# Patient Record
Sex: Female | Born: 1965 | Race: White | Hispanic: No | Marital: Married | Smoking: Never smoker
Health system: Southern US, Academic
[De-identification: ages and names within clinical notes are randomized; demographics above are authoritative.]

## PROBLEM LIST (undated history)

## (undated) DIAGNOSIS — F3289 Other specified depressive episodes: Secondary | ICD-10-CM

## (undated) DIAGNOSIS — M129 Arthropathy, unspecified: Secondary | ICD-10-CM

## (undated) HISTORY — PX: CLAVICLE SURGERY: SHX598

---

## 1995-04-04 ENCOUNTER — Other Ambulatory Visit (HOSPITAL_COMMUNITY): Payer: Self-pay

## 2021-11-14 IMAGING — MR MRI CERVICAL SPINE WITHOUT CONTRAST
4 of 5 series · 23 of 48 positions shown · IV contrast (gadolinium)
Comparison: None available.

﻿EXAM:  [id number]   MRI CERVICAL SPINE WITHOUT CONTRAST
INDICATION: Neck pain with bilateral upper extremity numbness.
TECHNIQUE: Multiplanar multisequential MRI of the cervical spine was performed without gadolinium contrast.

[Series 5: T2 · sagittal · 3.0mm · 0.75mm/px · 8 of 15 slices shown (1 of 2)]
[im 1/15]
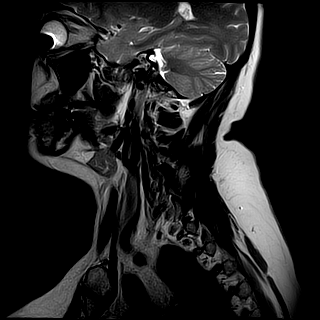
[im 3/15]
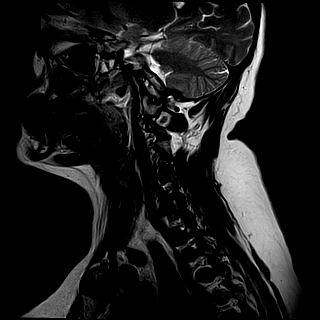
[im 5/15]
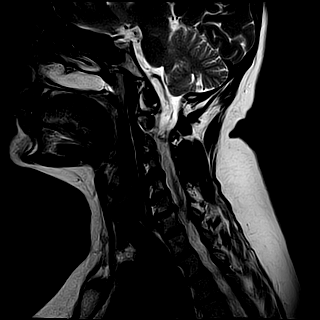
[im 7/15]
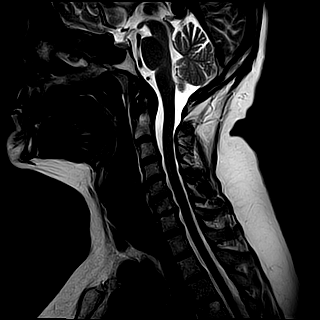
[im 9/15]
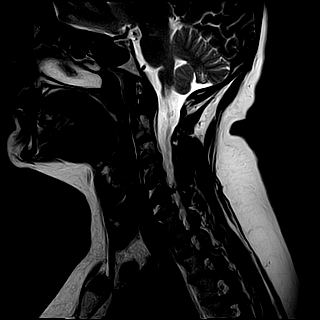
[im 11/15]
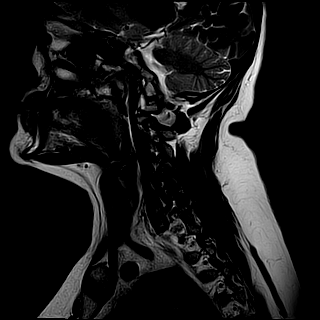
[im 13/15]
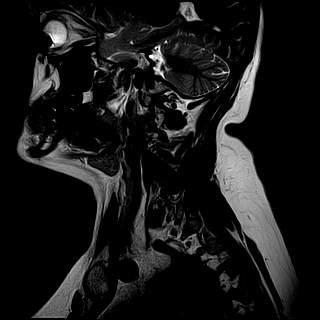
[im 15/15]
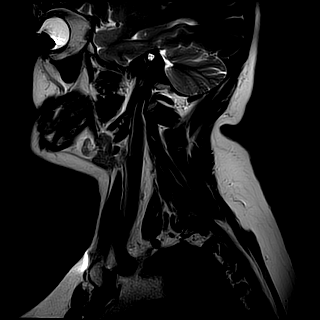

[Series 6: T1 · sagittal · 3.0mm · 0.47mm/px · 3 of 15 slices shown]
[im 2/15]
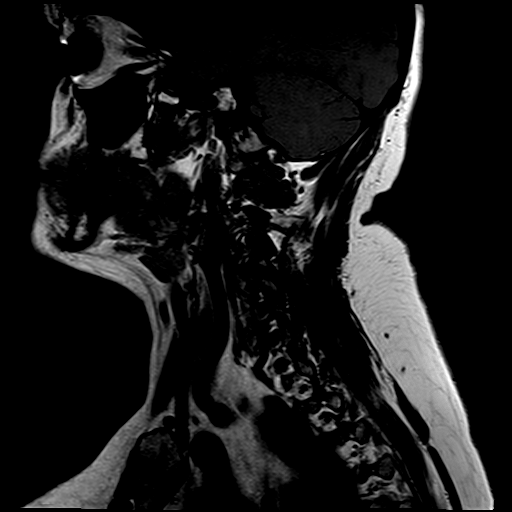
[im 8/15]
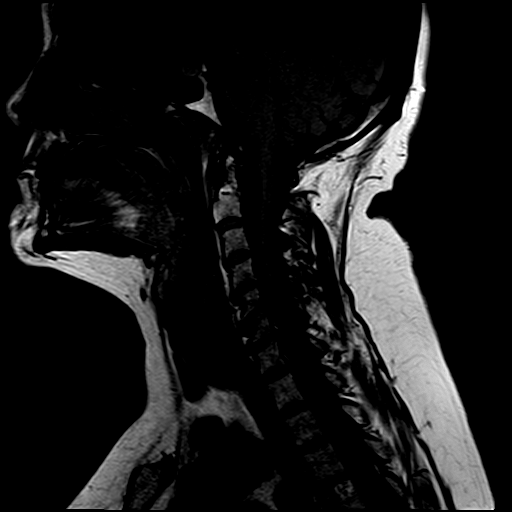
[im 13/15]
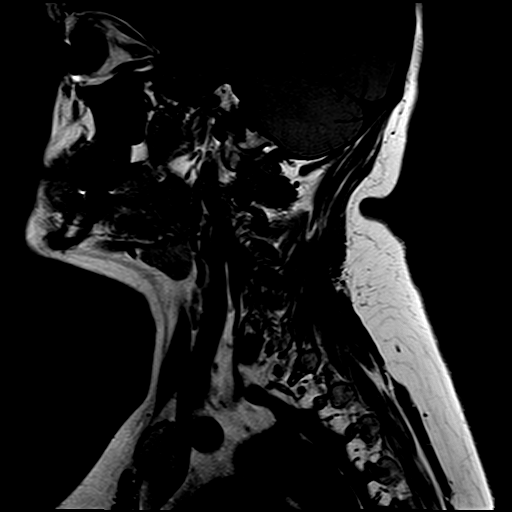

[Series 7: STIR · sagittal · 3.0mm · 0.47mm/px · 3 of 15 slices shown]
[im 2/15]
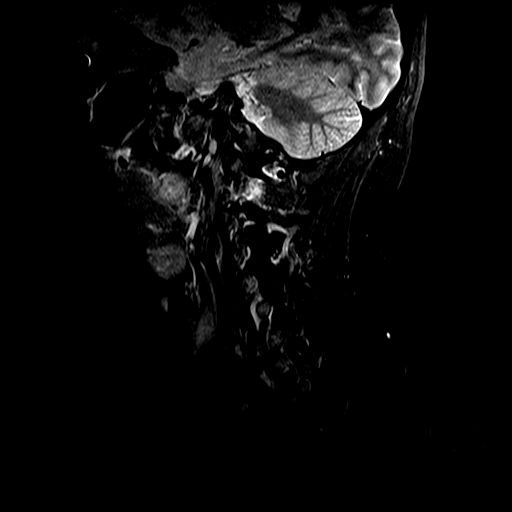
[im 8/15]
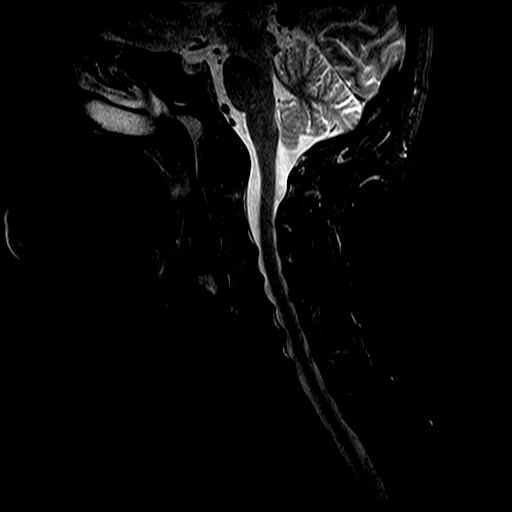
[im 13/15]
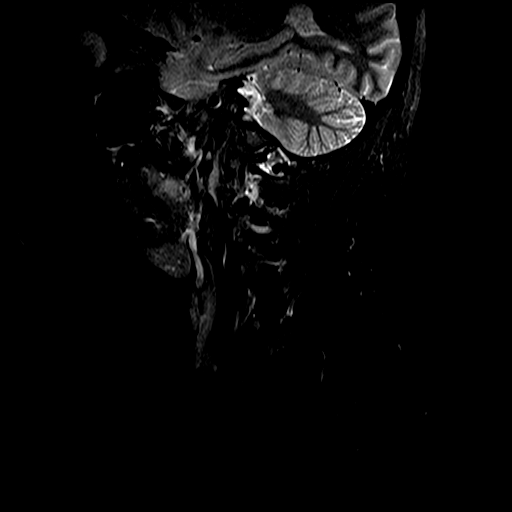

[Series 9: T2 · axial · 3.0mm · 0.39mm/px · z∈[-93,-7]mm · 9 of 18 slices shown (2 of 2)]
[im 1/18]
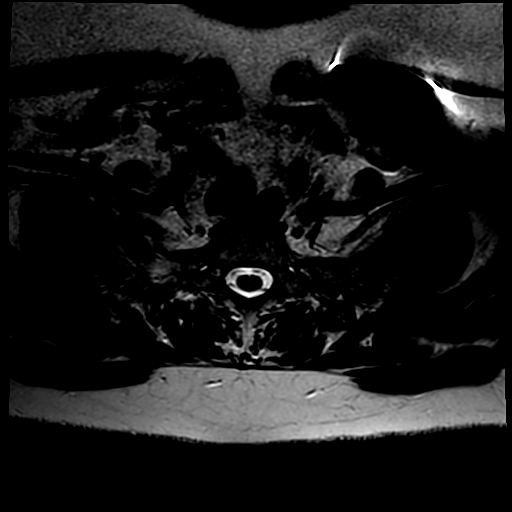
[im 2/18]
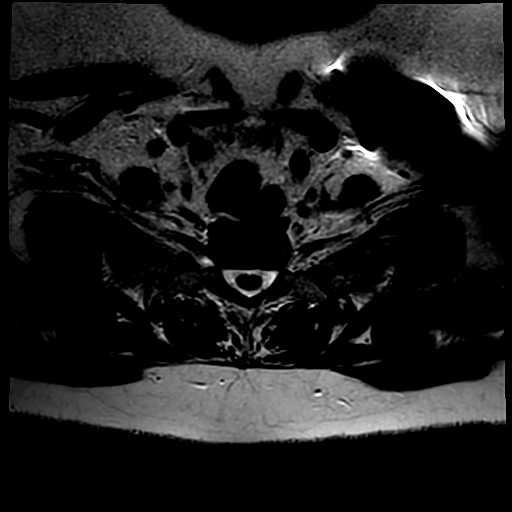
[im 4/18]
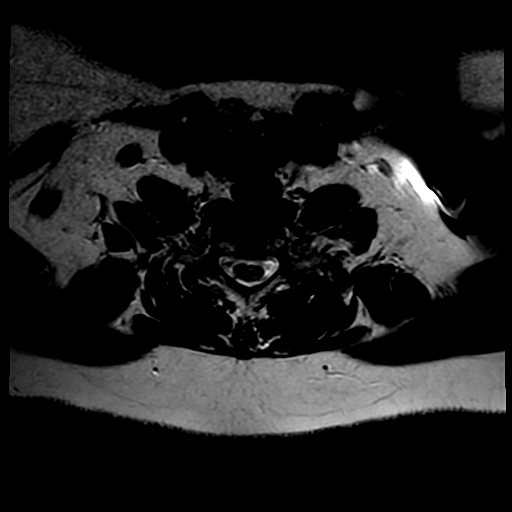
[im 6/18]
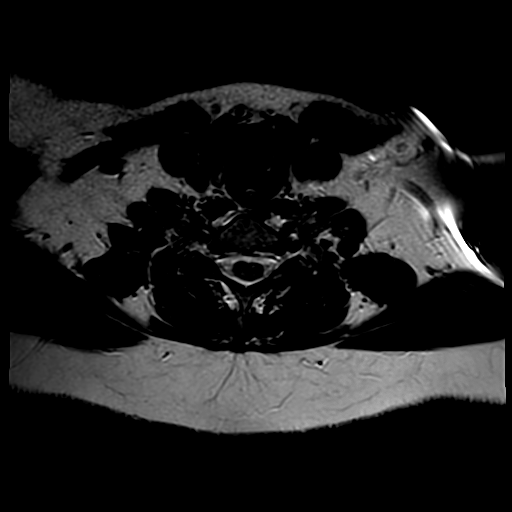
[im 7/18]
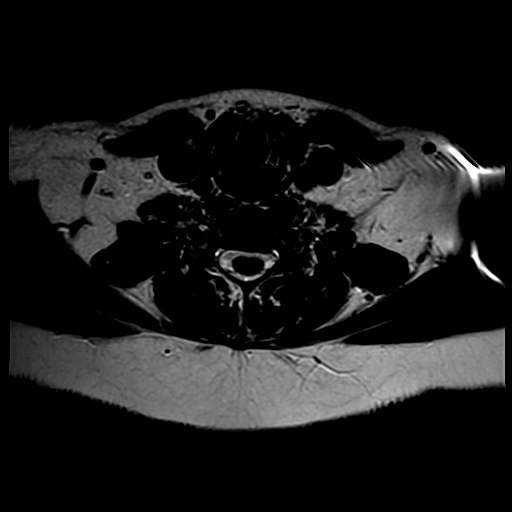
[im 9/18]
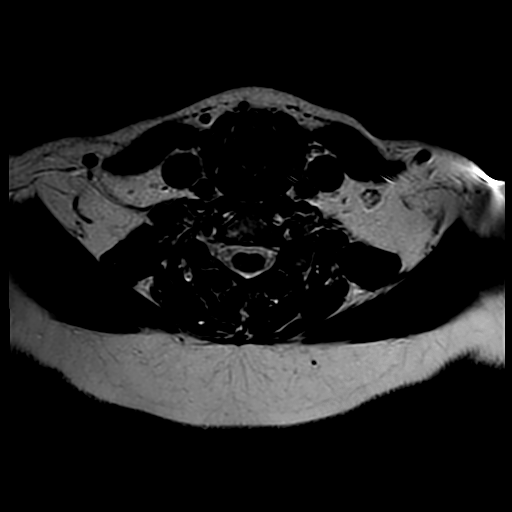
[im 11/18]
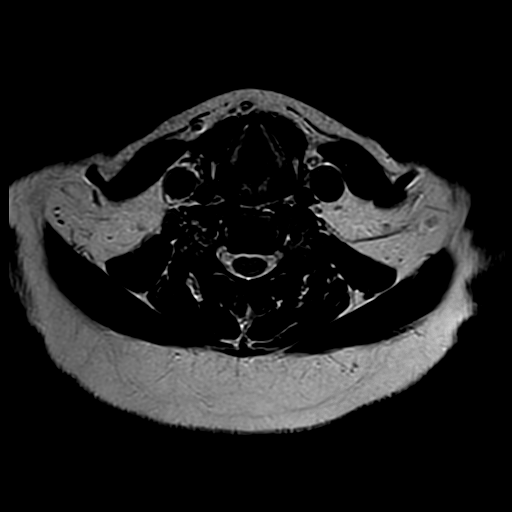
[im 12/18]
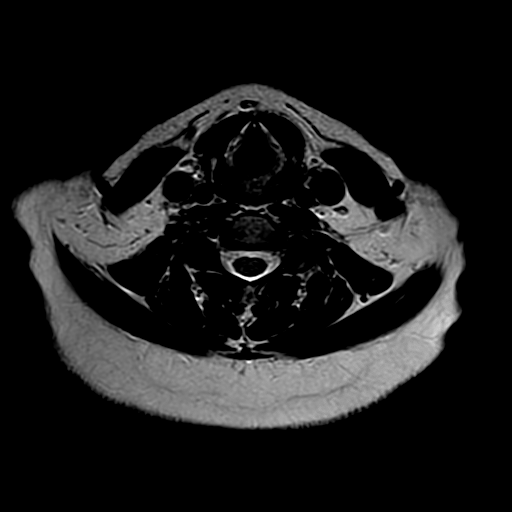
[im 16/18]
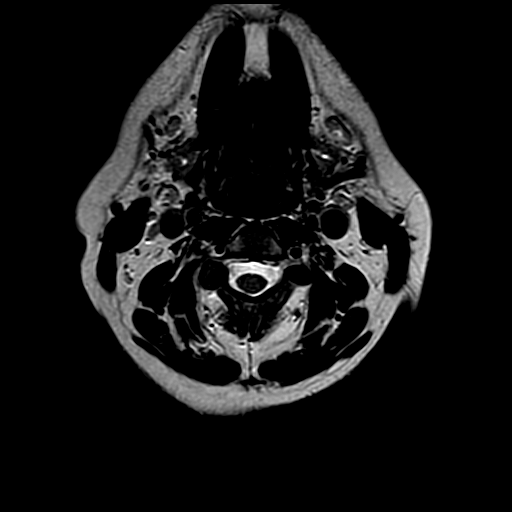

[23 of 48 positions shown; findings below may reference images not displayed]

FINDINGS: Vertebral bodies are normal in height, alignment and signal intensity. There is no acute fracture or subluxation. Visualized spinal cord is normal in signal intensity without evidence of compression at any level.

C2-3 level is unremarkable.

At C3-4 level, there is a small broad-based central disc osteophyte complex partially effacing the ventral CSF. There is no significant neural foraminal stenosis.

At C4-5 level, there is a small broad-based central disc osteophyte complex partially effacing the ventral CSF. There is no significant neural foraminal stenosis.

At C5-6 level, there is moderate to marked disc desiccation. There is a minimal bulging annulus, minimally effacing the ventral CSF. There is severe left neural foraminal stenosis from uncovertebral joint hypertrophy.

At C6-7 level, there is moderate to marked disc desiccation. There is a small broad-based central disc osteophyte complex with near complete effacement of the ventral CSF. There is mild right neural foraminal stenosis from uncovertebral joint hypertrophy.

At C7-T1 level, there is a small broad-based central disc osteophyte complex mildly effacing the ventral CSF. There is no significant neural foraminal stenosis.

Paraspinal soft tissues are unremarkable.
IMPRESSION: 1. Small disc osteophyte complexes at multiple levels without significant spinal stenosis at any level. 

2. Multilevel neural foraminal stenosis as detailed above.

## 2022-01-12 IMAGING — MR MRI LUMBAR SPINE WITHOUT CONTRAST
6 of 7 series · 33 of 48 positions shown · IV contrast (gadolinium)
Comparison: None available.

﻿EXAM:  [id number]   MRI LUMBAR SPINE WITHOUT CONTRAST
INDICATION: Chronic lower back pain.
TECHNIQUE: Multiplanar multisequential MRI of the lumbar spine was performed without gadolinium contrast.

[Series 5: T2 · sagittal · 4.0mm · 0.83mm/px · 4 of 13 slices shown (1 of 4)]
[im 1/13]
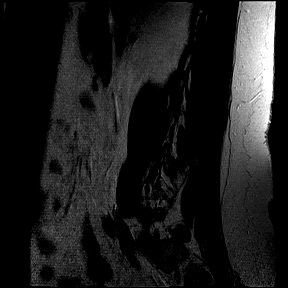
[im 5/13]
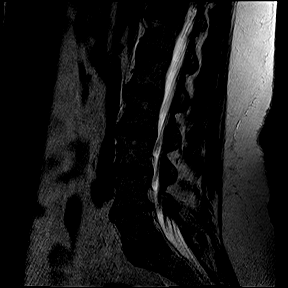
[im 9/13]
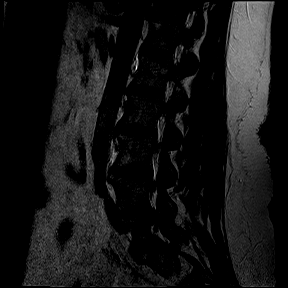
[im 13/13]
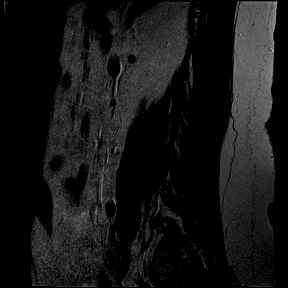

[Series 7: T2 · sagittal · 4.0mm · 0.83mm/px · 5 of 13 slices shown (2 of 4)]
[im 1/13]
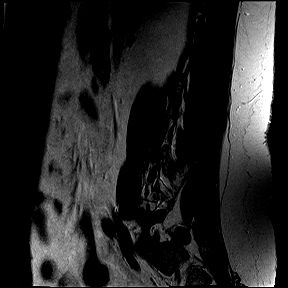
[im 4/13]
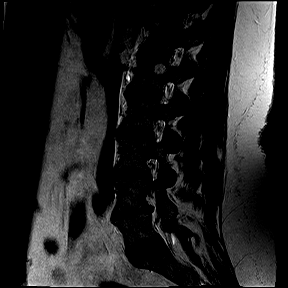
[im 7/13]
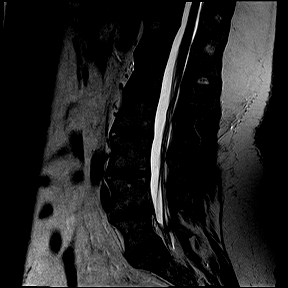
[im 10/13]
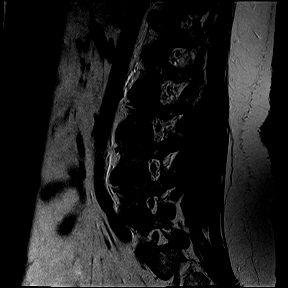
[im 13/13]
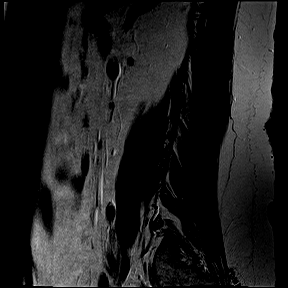

[Series 8: T1 · sagittal · 4.0mm · 0.83mm/px · 5 of 13 slices shown (1 of 2)]
[im 1/13]
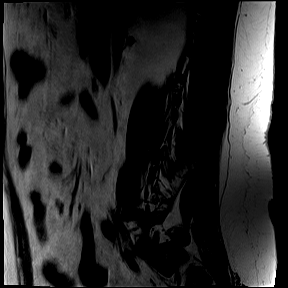
[im 4/13]
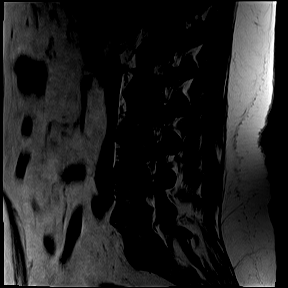
[im 7/13]
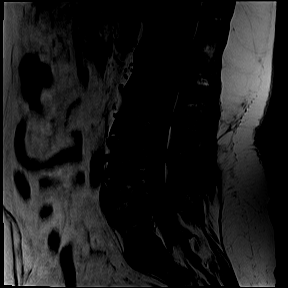
[im 10/13]
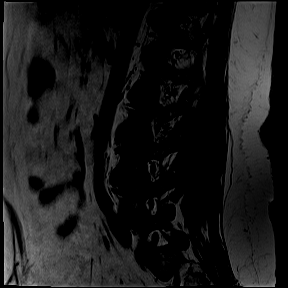
[im 13/13]
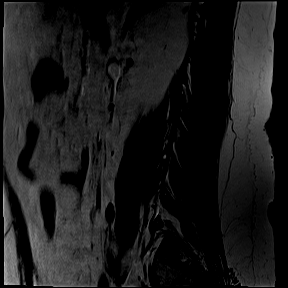

[Series 10: T1 · axial · 4.0mm · 0.52mm/px · 1 of 29 slices shown (2 of 2)]
[im 1/29]
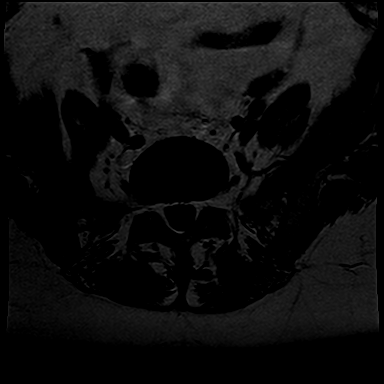

[Series 11: T2 · axial · 4.0mm · 0.52mm/px · z∈[-154,+121]mm · 11 of 29 slices shown (3 of 4)]
[im 1/29]
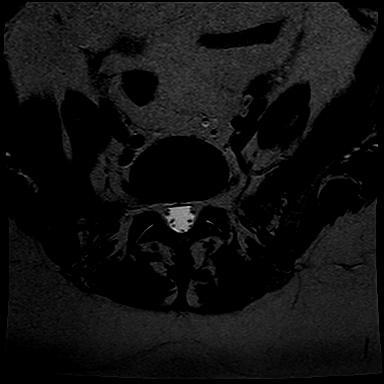
[im 3/29]
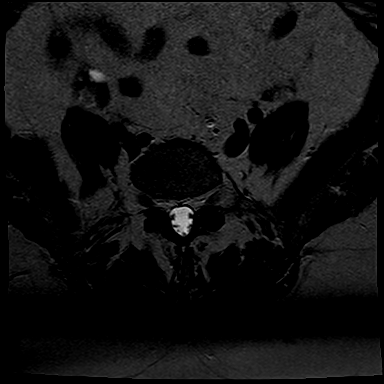
[im 6/29]
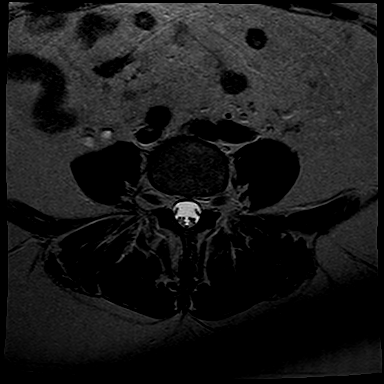
[im 9/29]
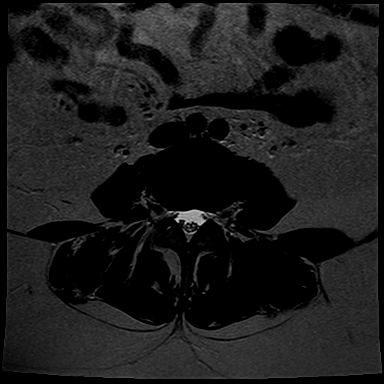
[im 12/29]
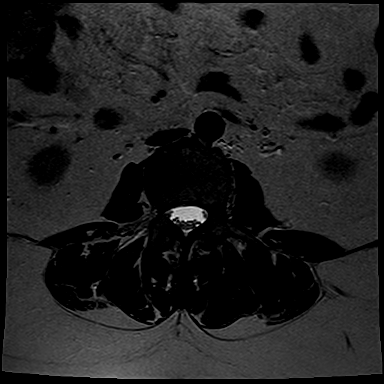
[im 15/29]
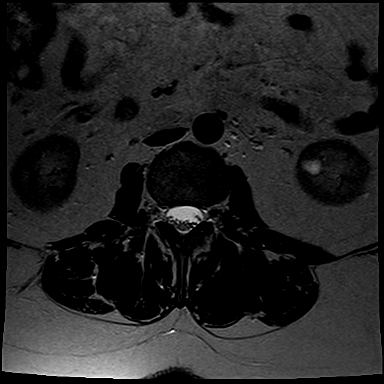
[im 17/29]
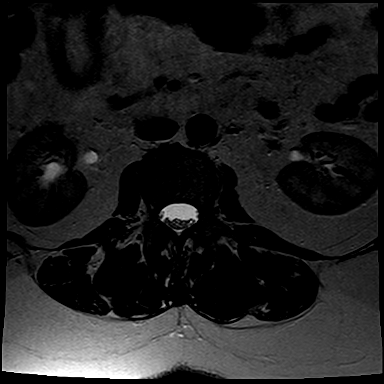
[im 20/29]
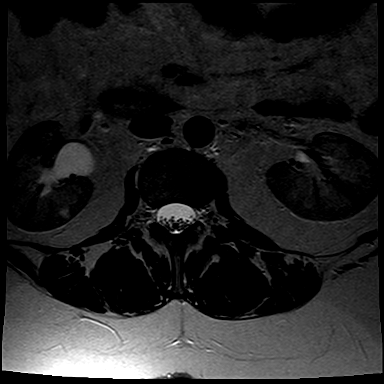
[im 23/29]
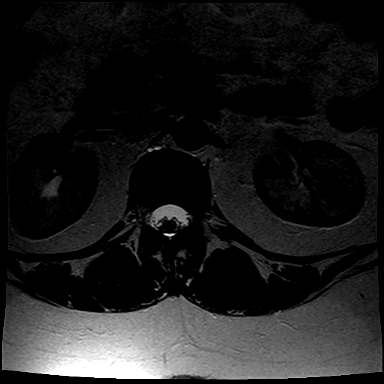
[im 26/29]
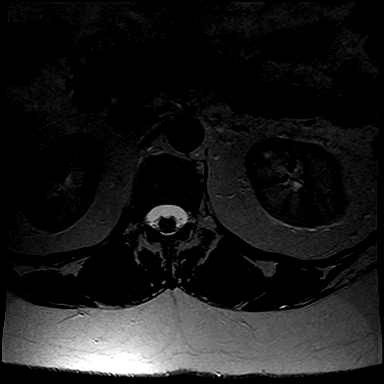
[im 29/29]
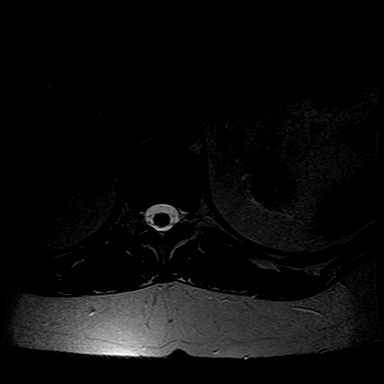

[Series 12: T2 · coronal · 5.0mm · 0.82mm/px · 7 of 18 slices shown (4 of 4)]
[im 1/18]
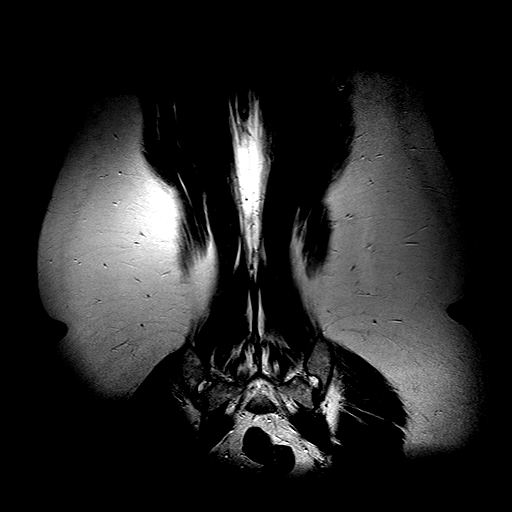
[im 3/18]
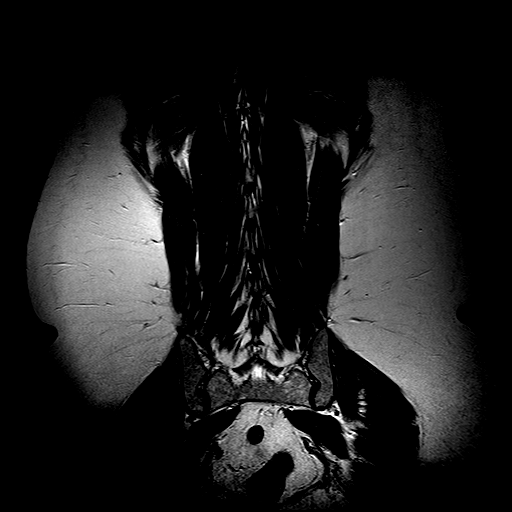
[im 6/18]
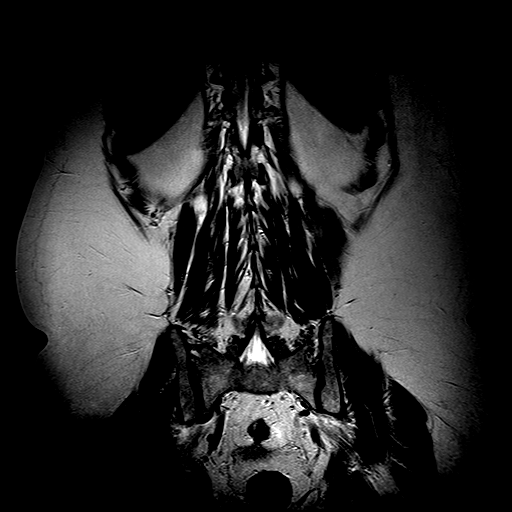
[im 9/18]
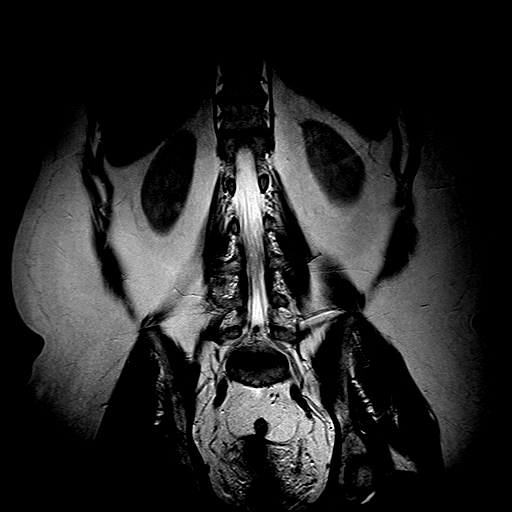
[im 12/18]
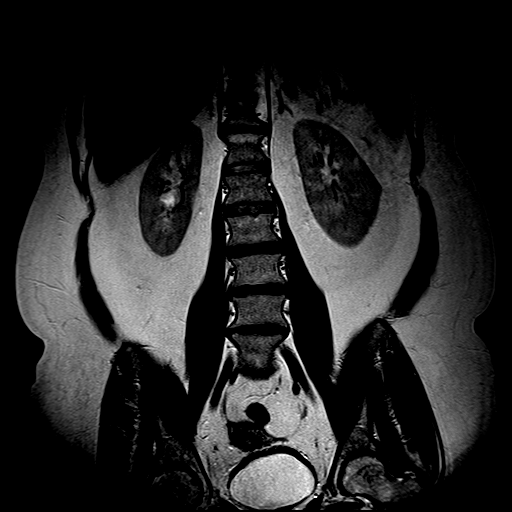
[im 15/18]
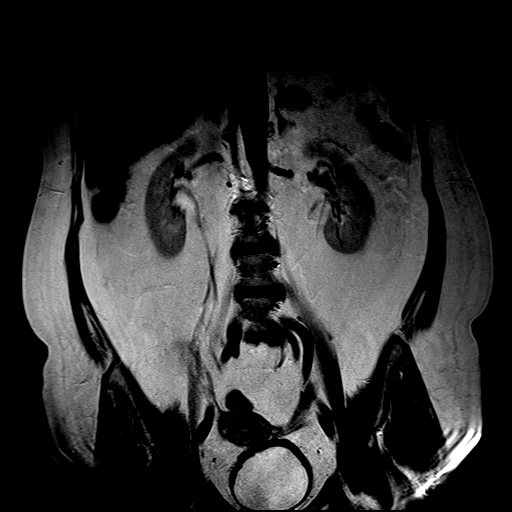
[im 18/18]
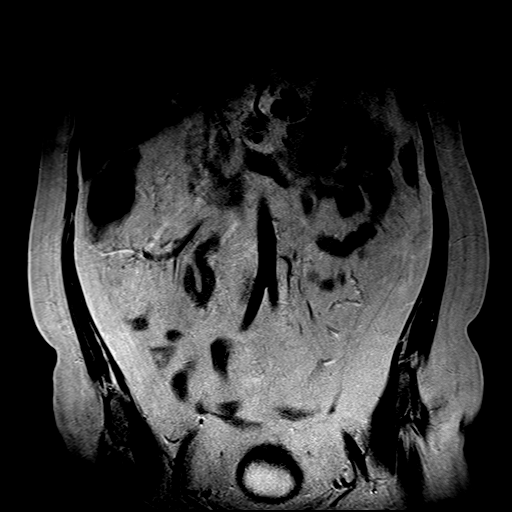

[33 of 48 positions shown; findings below may reference images not displayed]

FINDINGS: Vertebral bodies are normal in height, alignment and signal intensity. There is no acute fracture or subluxation. Distal spinal cord is normal in signal intensity and terminates normally at T12-L1 disc space level. Spinal canal is congenitally narrow.

L1-2 level is unremarkable.

At L2-3 level, there is a small broad-based central disc bulge, mildly effacing the ventral thecal sac. There is no significant neural foraminal stenosis.

At L3-4 level, there is a small broad-based central and right paracentral disc bulge, mildly effacing the ventral thecal sac. There is mild right neural foraminal stenosis from bulging annulus without nerve root impingement.

At L4-5 level, there is a small broad-based central disc bulge mildly effacing the ventral thecal sac. There is mild right neural foraminal stenosis from bulging annulus without nerve root impingement.

At L5-S1 level, there is a minimal bulging annulus without mass effect on the thecal sac. There is mild bilateral neural foraminal stenosis from facet arthropathy without nerve root impingement.

Paraspinal soft tissues are unremarkable.
IMPRESSION: 1. No significant disc herniation or spinal stenosis at any level. 

2. Multilevel neural foraminal stenosis as detailed above.

## 2022-03-15 ENCOUNTER — Other Ambulatory Visit: Payer: Self-pay

## 2022-03-15 ENCOUNTER — Emergency Department (EMERGENCY_DEPARTMENT_HOSPITAL): Payer: No Typology Code available for payment source

## 2022-03-15 ENCOUNTER — Emergency Department
Admission: EM | Admit: 2022-03-15 | Discharge: 2022-03-15 | Disposition: A | Discharge status: Home / Self Care | Payer: No Typology Code available for payment source | Attending: Nurse Practitioner | Admitting: Nurse Practitioner

## 2022-03-15 ENCOUNTER — Encounter (HOSPITAL_COMMUNITY): Payer: Self-pay

## 2022-03-15 DIAGNOSIS — X500XXA Overexertion from strenuous movement or load, initial encounter: Secondary | ICD-10-CM | POA: Insufficient documentation

## 2022-03-15 DIAGNOSIS — Y92811 Bus as the place of occurrence of the external cause: Secondary | ICD-10-CM | POA: Insufficient documentation

## 2022-03-15 DIAGNOSIS — M62830 Muscle spasm of back: Secondary | ICD-10-CM | POA: Insufficient documentation

## 2022-03-15 DIAGNOSIS — Y990 Civilian activity done for income or pay: Secondary | ICD-10-CM | POA: Insufficient documentation

## 2022-03-15 DIAGNOSIS — E669 Obesity, unspecified: Secondary | ICD-10-CM | POA: Insufficient documentation

## 2022-03-15 DIAGNOSIS — M545 Low back pain, unspecified: Secondary | ICD-10-CM

## 2022-03-15 DIAGNOSIS — S39012A Strain of muscle, fascia and tendon of lower back, initial encounter: Secondary | ICD-10-CM

## 2022-03-15 HISTORY — DX: Arthropathy, unspecified: M12.9

## 2022-03-15 HISTORY — DX: Other specified depressive episodes: F32.89

## 2022-03-15 MED ORDER — METHOCARBAMOL 500 MG TABLET
1000.0000 mg | ORAL_TABLET | Freq: Four times a day (QID) | ORAL | Status: DC
Start: 2022-03-15 — End: 2022-03-15
  Administered 2022-03-15: 1000 mg via ORAL

## 2022-03-15 MED ORDER — METHOCARBAMOL 500 MG TABLET
500.0000 mg | ORAL_TABLET | Freq: Four times a day (QID) | ORAL | 0 refills | Status: AC
Start: 2022-03-15 — End: 2022-03-20

## 2022-03-15 MED ORDER — KETOROLAC 60 MG/2 ML INTRAMUSCULAR SOLUTION
INTRAMUSCULAR | Status: AC
Start: 2022-03-15 — End: 2022-03-15
  Filled 2022-03-15: qty 2

## 2022-03-15 MED ORDER — IBUPROFEN 600 MG TABLET
600.0000 mg | ORAL_TABLET | Freq: Four times a day (QID) | ORAL | 0 refills | Status: AC | PRN
Start: 2022-03-15 — End: ?

## 2022-03-15 MED ORDER — KETOROLAC 60 MG/2 ML INTRAMUSCULAR SOLUTION
60.0000 mg | INTRAMUSCULAR | Status: AC
Start: 2022-03-15 — End: 2022-03-15
  Administered 2022-03-15: 60 mg via INTRAMUSCULAR

## 2022-03-15 MED ORDER — METHOCARBAMOL 500 MG TABLET
ORAL_TABLET | ORAL | Status: AC
Start: 2022-03-15 — End: 2022-03-15
  Filled 2022-03-15: qty 2

## 2022-03-15 NOTE — ED Nurses Note (Signed)
PT ASSIGNED TO ER MINOR ROOM 27. PT HERE TODAY WITH C/O OF RT SIDED BACK PAIN. NO SIGNS OF DISTRESS. RESPIRATIONS EVEN AND UNLABORED. AWAITING PROVIDER'S ASSESSMENT AND ORDERS.
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
## 2022-03-15 NOTE — ED Nurses Note (Signed)
Patient discharged home with family.  AVS reviewed with patient/care giver.  A written copy of the AVS and discharge instructions was given to the patient/care giver.  Questions sufficiently answered as needed.  Patient/care giver encouraged to follow up with PCP as indicated.  In the event of an emergency, patient/care giver instructed to call 911 or go to the nearest emergency room.
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
## 2022-03-15 NOTE — Discharge Instructions (Signed)
Drink plenty of water.  You may use warm moist compress to your lower back for 20 minutes intervals every 4 hours while awake.  Take Motrin 600 mg every 6 hours as needed for pain.  Take Robaxin every 6 hours as needed for back muscle spasms.  Follow-up with your primary care provider 1-3 days.  Return to the emergency department if worse or as needed.  We hope you feel better.

## 2022-04-12 ENCOUNTER — Other Ambulatory Visit (HOSPITAL_COMMUNITY): Payer: Self-pay | Admitting: NURSE PRACTITIONER

## 2022-04-12 DIAGNOSIS — M5416 Radiculopathy, lumbar region: Secondary | ICD-10-CM

## 2022-04-21 ENCOUNTER — Inpatient Hospital Stay
Admission: RE | Admit: 2022-04-21 | Discharge: 2022-04-21 | Disposition: A | Discharge status: Home / Self Care | Payer: 59 | Source: Ambulatory Visit | Attending: NURSE PRACTITIONER | Admitting: NURSE PRACTITIONER

## 2022-04-21 ENCOUNTER — Other Ambulatory Visit: Payer: Self-pay

## 2022-04-21 DIAGNOSIS — M5416 Radiculopathy, lumbar region: Secondary | ICD-10-CM | POA: Insufficient documentation

## 2023-01-30 IMAGING — MR MRI CERVICAL SPINE WITHOUT CONTRAST
4 of 5 series · 24 of 48 positions shown · non-contrast
Comparison: None available.

﻿EXAM:  [id number]   MRI CERVICAL SPINE WITHOUT CONTRAST
INDICATION: Neck pain, left arm numbness.
TECHNIQUE: Noncontrast multiplanar, multisequence MRI was performed.

[Series 5: T2 · sagittal · 3.0mm · 0.75mm/px · 8 of 13 slices shown (1 of 2)]
[im 1/13]
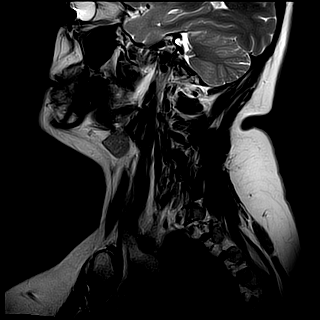
[im 2/13]
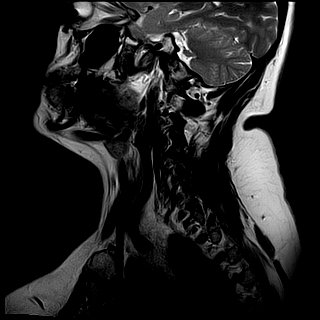
[im 4/13]
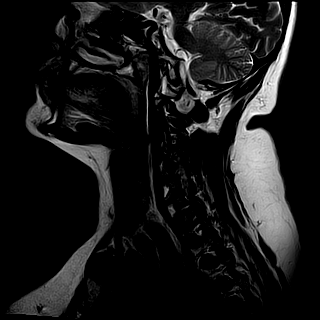
[im 6/13]
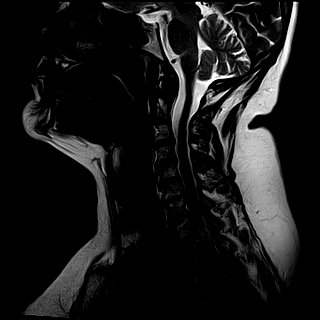
[im 7/13]
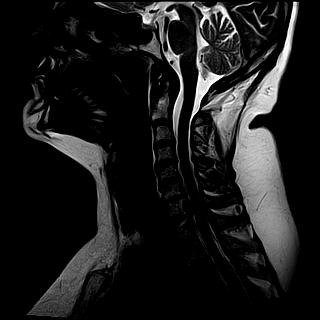
[im 9/13]
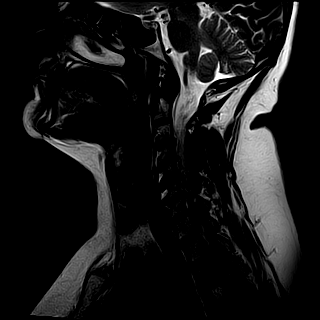
[im 11/13]
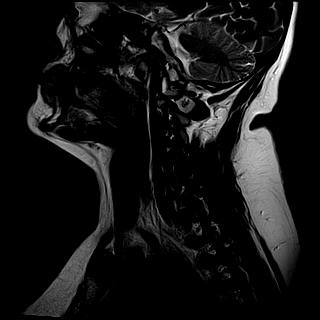
[im 13/13]
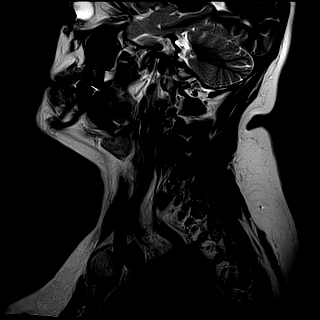

[Series 6: T1 · sagittal · 3.0mm · 0.47mm/px · 4 of 13 slices shown]
[im 1/13]
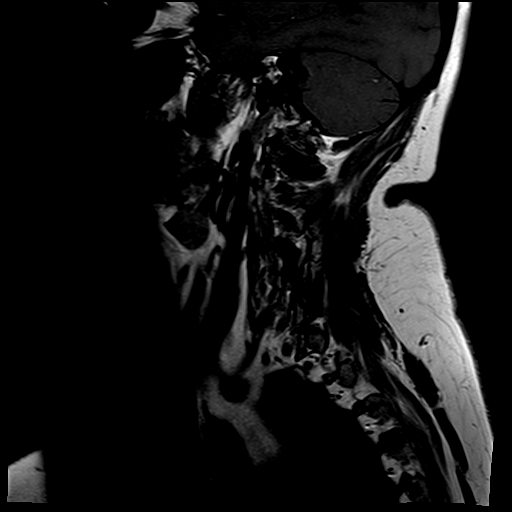
[im 2/13]
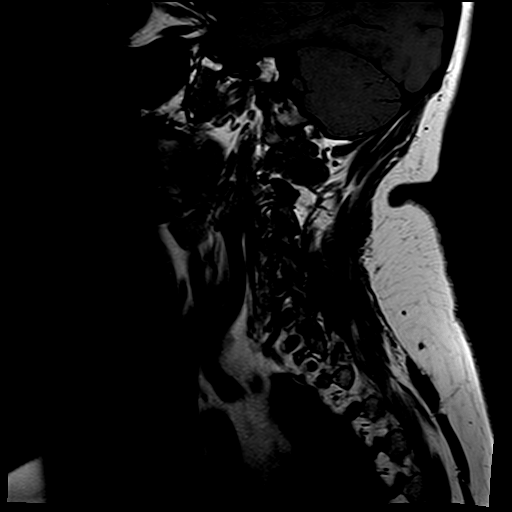
[im 7/13]
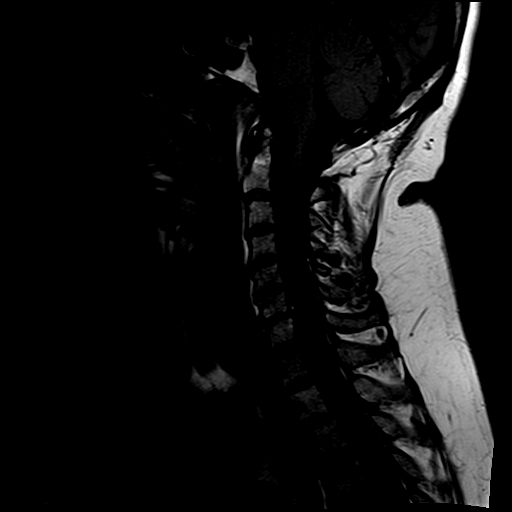
[im 11/13]
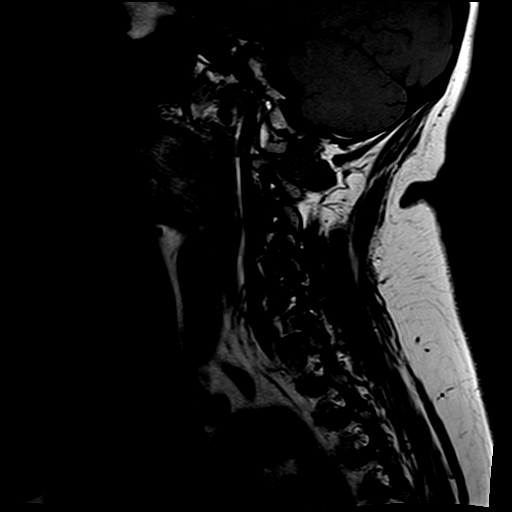

[Series 7: STIR · sagittal · 3.0mm · 0.47mm/px · 3 of 13 slices shown]
[im 2/13]
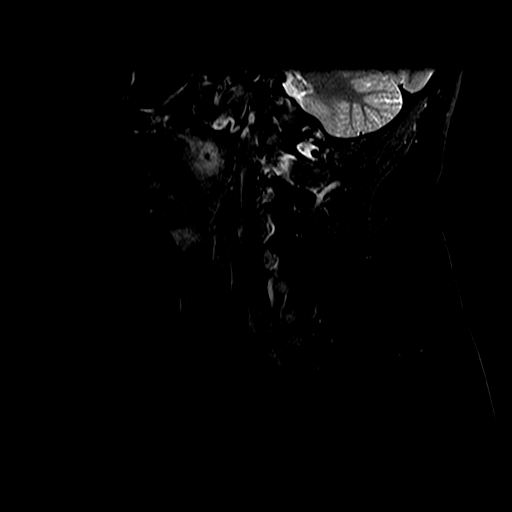
[im 7/13]
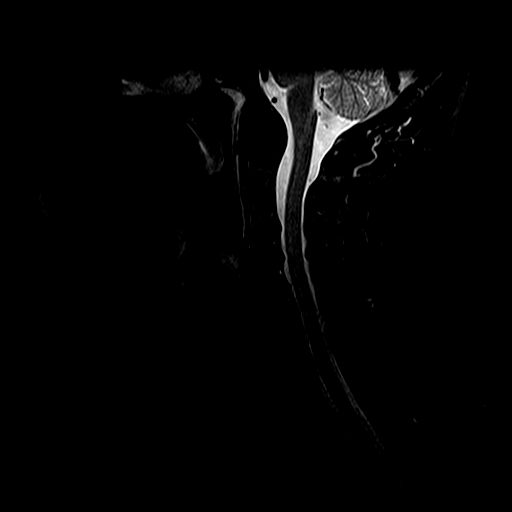
[im 11/13]
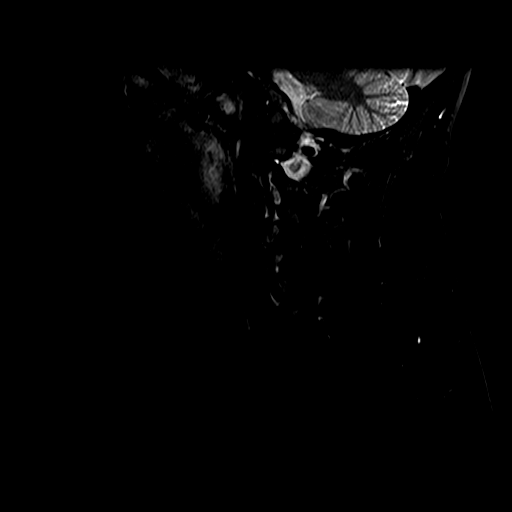

[Series 9: T2 · axial · 3.0mm · 0.39mm/px · z∈[-116,-13]mm · 9 of 18 slices shown (2 of 2)]
[im 1/18]
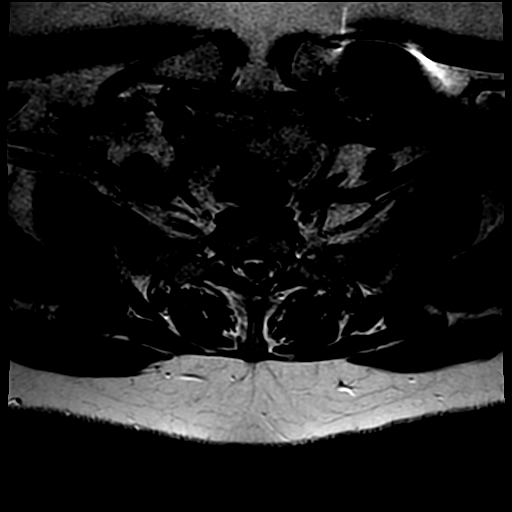
[im 4/18]
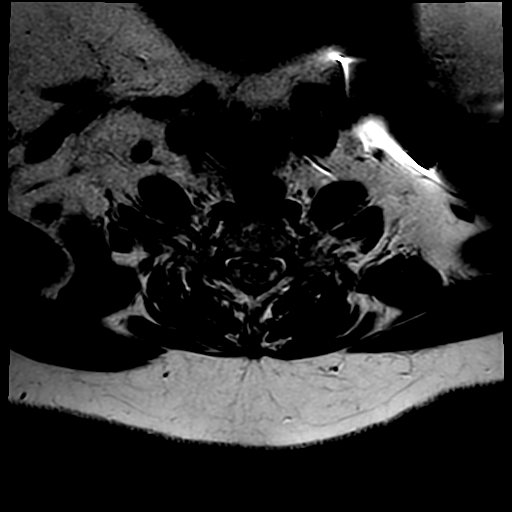
[im 5/18]
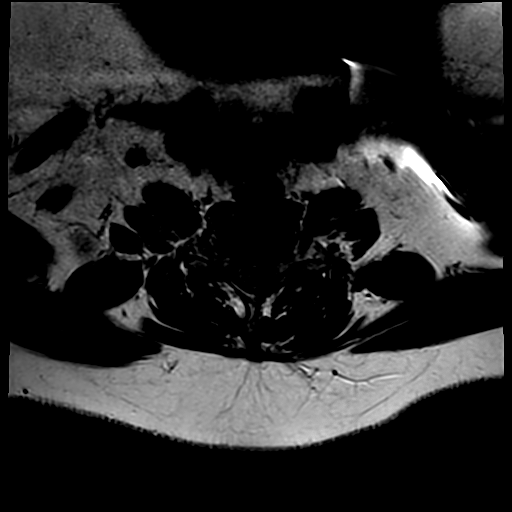
[im 8/18]
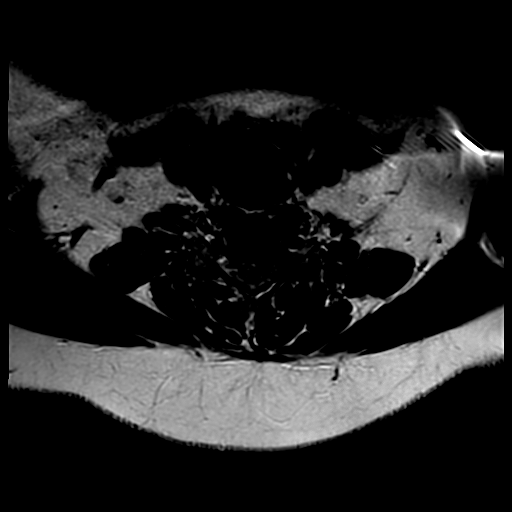
[im 10/18]
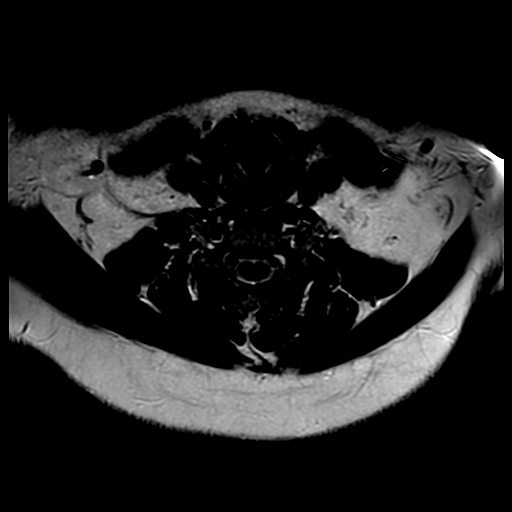
[im 13/18]
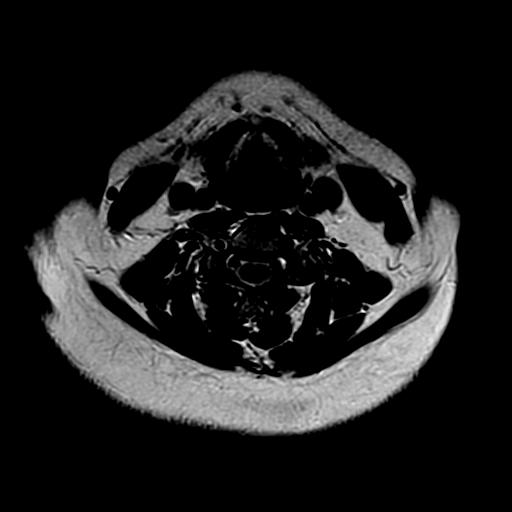
[im 14/18]
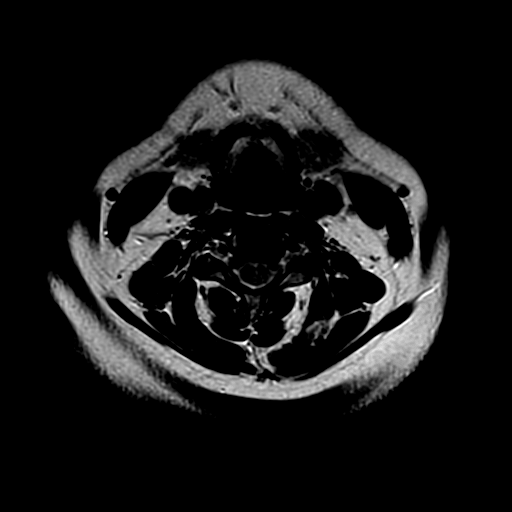
[im 16/18]
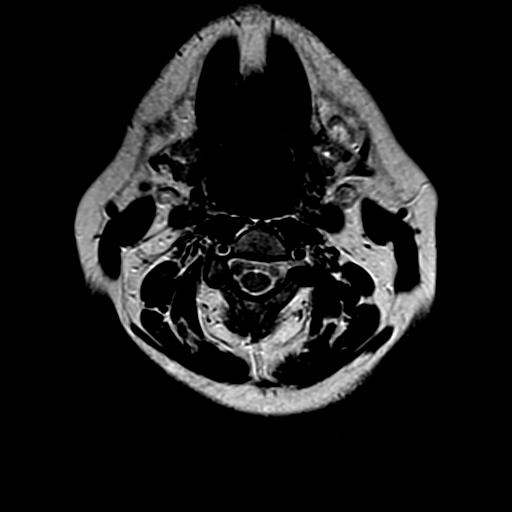
[im 18/18]
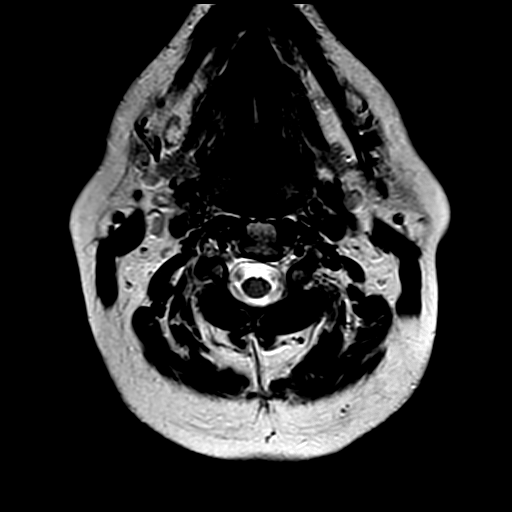

[24 of 48 positions shown; findings below may reference images not displayed]

FINDINGS: There are mild to moderate degenerative changes at multiple levels.  There are small disc osteophyte complexes at multiple levels from C3 through T1.  There is slight compression of the thecal sac at these levels but no evidence of spinal cord compression or significant spinal stenosis.  The appearance is unchanged compared to the prior exam dated November 14, 2021.  

There is no fracture, malalignment, significant marrow signal alteration, abnormality of the spinal cord, or Chiari malformation.  No disc herniation is seen.
IMPRESSION: 1. Mild to moderate degenerative changes. 

2. Multiple small disc osteophyte complexes.  

3. No fracture, disc herniation, or spinal stenosis.  

4. No significant change compared to November 14, 2021.

## 2023-02-19 ENCOUNTER — Inpatient Hospital Stay
Admission: RE | Admit: 2023-02-19 | Discharge: 2023-02-19 | Disposition: A | Discharge status: Home / Self Care | Payer: 59 | Source: Ambulatory Visit | Attending: Neurological Surgery | Admitting: Neurological Surgery

## 2023-02-19 ENCOUNTER — Other Ambulatory Visit: Payer: Self-pay

## 2023-02-19 ENCOUNTER — Other Ambulatory Visit (HOSPITAL_COMMUNITY): Payer: Self-pay | Admitting: Neurological Surgery

## 2023-02-19 DIAGNOSIS — M5412 Radiculopathy, cervical region: Secondary | ICD-10-CM

## 2023-02-20 DIAGNOSIS — M5412 Radiculopathy, cervical region: Secondary | ICD-10-CM

## 2023-07-01 ENCOUNTER — Inpatient Hospital Stay
Admission: RE | Admit: 2023-07-01 | Discharge: 2023-07-01 | Disposition: A | Discharge status: Home / Self Care | Payer: 59 | Source: Ambulatory Visit | Attending: Neurological Surgery | Admitting: Neurological Surgery

## 2023-07-01 ENCOUNTER — Other Ambulatory Visit: Payer: Self-pay

## 2023-07-01 ENCOUNTER — Other Ambulatory Visit (HOSPITAL_COMMUNITY): Payer: Self-pay | Admitting: Neurological Surgery

## 2023-07-01 DIAGNOSIS — Z4789 Encounter for other orthopedic aftercare: Secondary | ICD-10-CM

## 2023-08-12 ENCOUNTER — Inpatient Hospital Stay
Admission: RE | Admit: 2023-08-12 | Discharge: 2023-08-12 | Disposition: A | Discharge status: Home / Self Care | Payer: 59 | Source: Ambulatory Visit | Attending: Neurological Surgery | Admitting: Neurological Surgery

## 2023-08-12 ENCOUNTER — Other Ambulatory Visit (HOSPITAL_COMMUNITY): Payer: Self-pay | Admitting: Neurological Surgery

## 2023-08-12 ENCOUNTER — Other Ambulatory Visit: Payer: Self-pay

## 2023-08-12 DIAGNOSIS — Z981 Arthrodesis status: Secondary | ICD-10-CM | POA: Insufficient documentation

## 2023-08-12 DIAGNOSIS — Z4789 Encounter for other orthopedic aftercare: Secondary | ICD-10-CM | POA: Insufficient documentation

## 2024-07-05 ENCOUNTER — Other Ambulatory Visit: Payer: Self-pay

## 2024-07-05 ENCOUNTER — Emergency Department
Admission: EM | Admit: 2024-07-05 | Discharge: 2024-07-06 | Disposition: A | Discharge status: Home / Self Care | Attending: Physician Assistant | Admitting: Physician Assistant

## 2024-07-05 DIAGNOSIS — D72829 Elevated white blood cell count, unspecified: Secondary | ICD-10-CM | POA: Insufficient documentation

## 2024-07-05 DIAGNOSIS — N390 Urinary tract infection, site not specified: Secondary | ICD-10-CM | POA: Insufficient documentation

## 2024-07-05 DIAGNOSIS — N200 Calculus of kidney: Secondary | ICD-10-CM | POA: Insufficient documentation

## 2024-07-05 LAB — COMPREHENSIVE METABOLIC PANEL, NON-FASTING
ALBUMIN/GLOBULIN RATIO: 1.7 — ABNORMAL HIGH (ref 0.8–1.4)
ALBUMIN: 4.7 g/dL (ref 3.5–5.7)
ALKALINE PHOSPHATASE: 83 U/L (ref 34–104)
ALT (SGPT): 18 U/L (ref 7–52)
ANION GAP: 9 mmol/L (ref 4–13)
AST (SGOT): 20 U/L (ref 13–39)
BILIRUBIN TOTAL: 0.7 mg/dL (ref 0.3–1.0)
BUN/CREA RATIO: 23 — ABNORMAL HIGH (ref 6–22)
BUN: 16 mg/dL (ref 7–25)
CALCIUM, CORRECTED: 9.2 mg/dL (ref 8.9–10.8)
CALCIUM: 9.8 mg/dL (ref 8.6–10.3)
CHLORIDE: 104 mmol/L (ref 98–107)
CO2 TOTAL: 23 mmol/L (ref 21–31)
CREATININE: 0.71 mg/dL (ref 0.60–1.30)
ESTIMATED GFR: 100 mL/min/1.73mˆ2 (ref >59)
GLOBULIN: 2.8 (ref 2.0–3.5)
GLUCOSE: 128 mg/dL — ABNORMAL HIGH (ref 74–109)
OSMOLALITY, CALCULATED: 275 mosm/kg (ref 270–290)
POTASSIUM: 3.9 mmol/L (ref 3.5–5.1)
PROTEIN TOTAL: 7.5 g/dL (ref 6.4–8.9)
SODIUM: 136 mmol/L (ref 136–145)

## 2024-07-05 LAB — URINALYSIS, MACROSCOPIC
BILIRUBIN: NEGATIVE mg/dL
BLOOD: 0.03 mg/dL
GLUCOSE: NEGATIVE mg/dL
KETONES: 40 mg/dL — AB
LEUKOCYTES: 500 WBCs/uL — AB
NITRITE: NEGATIVE
PH: 6.5 (ref 5.0–9.0)
PROTEIN: NEGATIVE mg/dL
SPECIFIC GRAVITY: 1.023 (ref 1.002–1.030)
UROBILINOGEN: NORMAL mg/dL

## 2024-07-05 LAB — CBC WITH DIFF
BASOPHIL #: 0.1 x10ˆ3/uL (ref 0.00–0.10)
BASOPHIL %: 0 % (ref 0–1)
EOSINOPHIL #: 0.3 x10ˆ3/uL (ref 0.00–0.50)
EOSINOPHIL %: 2 % (ref 1–7)
HCT: 40 % (ref 31.2–41.9)
HGB: 13.9 g/dL (ref 10.9–14.3)
LYMPHOCYTE #: 1.5 x10ˆ3/uL (ref 1.10–3.10)
LYMPHOCYTE %: 9 % — ABNORMAL LOW (ref 16–46)
MCH: 28.7 pg (ref 24.7–32.8)
MCHC: 34.8 g/dL (ref 32.3–35.6)
MCV: 82.5 fL (ref 75.5–95.3)
MONOCYTE #: 0.5 x10ˆ3/uL (ref 0.20–0.90)
MONOCYTE %: 3 % — ABNORMAL LOW (ref 4–11)
MPV: 6.6 fL — ABNORMAL LOW (ref 7.9–10.8)
NEUTROPHIL #: 13.8 x10ˆ3/uL — ABNORMAL HIGH (ref 1.90–8.20)
NEUTROPHIL %: 85 % — ABNORMAL HIGH (ref 43–77)
PLATELETS: 188 x10ˆ3/uL (ref 140–440)
RBC: 4.85 x10ˆ6/uL (ref 3.63–4.92)
RDW: 14.3 % (ref 12.3–17.7)
WBC: 16.1 x10ˆ3/uL — ABNORMAL HIGH (ref 3.8–11.8)

## 2024-07-05 LAB — URINALYSIS, MICROSCOPIC
RBCS: 6 /HPF — ABNORMAL HIGH (ref <4)
SQUAMOUS EPITHELIAL: 5 /HPF (ref <28)
WBCS: 104 /HPF — ABNORMAL HIGH (ref <6)

## 2024-07-05 LAB — LACTIC ACID LEVEL W/ REFLEX FOR LEVEL >2.0: LACTIC ACID: 1.2 mmol/L (ref 0.5–2.2)

## 2024-07-05 MED ORDER — SODIUM CHLORIDE 0.9 % INTRAVENOUS SOLUTION
INTRAVENOUS | Status: AC
Start: 2024-07-05 — End: 2024-07-05
  Filled 2024-07-05: qty 50

## 2024-07-05 MED ORDER — KETOROLAC 30 MG/ML (1 ML) INJECTION SOLUTION
INTRAMUSCULAR | Status: AC
Start: 2024-07-05 — End: 2024-07-05
  Filled 2024-07-05: qty 1

## 2024-07-05 MED ORDER — ONDANSETRON HCL (PF) 4 MG/2 ML INJECTION SOLUTION
INTRAMUSCULAR | Status: AC
Start: 2024-07-05 — End: 2024-07-05
  Filled 2024-07-05: qty 2

## 2024-07-05 MED ORDER — ONDANSETRON HCL (PF) 4 MG/2 ML INJECTION SOLUTION
4.0000 mg | INTRAMUSCULAR | Status: AC
Start: 2024-07-05 — End: 2024-07-05
  Administered 2024-07-05: 4 mg via INTRAVENOUS

## 2024-07-05 MED ORDER — SODIUM CHLORIDE 0.9 % INTRAVENOUS SOLUTION
1.0000 g | INTRAVENOUS | Status: AC
Start: 2024-07-05 — End: 2024-07-05
  Administered 2024-07-05: 1 g via INTRAVENOUS
  Administered 2024-07-05: 0 g via INTRAVENOUS

## 2024-07-05 MED ORDER — CEFTRIAXONE 1 GRAM SOLUTION FOR INJECTION
INTRAMUSCULAR | Status: AC
Start: 2024-07-05 — End: 2024-07-05
  Filled 2024-07-05: qty 10

## 2024-07-05 MED ORDER — KETOROLAC 30 MG/ML (1 ML) INJECTION SOLUTION
30.0000 mg | INTRAMUSCULAR | Status: AC
Start: 2024-07-05 — End: 2024-07-05
  Administered 2024-07-05: 30 mg via INTRAVENOUS

## 2024-07-05 NOTE — ED APP Handoff Note (Signed)
 [facility] - Emergency Department  Emergency Department  Provider in Triage Note    Name: Gloria May  Age: 58 y.o.  Gender: female     Subjective:   Gloria May is a 58 y.o. female who presents with complaint of Abdominal Pain  The patient presents to the ER today with complaints of nausea, lower abdominal pain, and low back pain. She reports it's been ongoing today. She denies dysuria or hematuria.     Objective:   Filed Vitals:    07/05/24 2158   BP: (!) 104/51   Pulse: 93   Resp: 20   Temp: 37.1 C (98.7 F)   SpO2: 97%      Vitals are also documented in the EMR.  Focused Physical Exam shows adult, well developed female in no acute distress.     Assessment:  A medical screening exam was completed.  This patient is a 58 y.o. female with Abdominal Pain  .    Plan:  Please see initial orders and work-up in the EMR.  This is to be continued with full evaluation in the main Emergency Department.     No current facility-administered medications for this encounter.     Results for orders placed or performed during the hospital encounter of 07/05/24 (from the past 24 hours)   CBC/DIFF    Collection Time: 07/05/24  9:59 PM    Narrative    The following orders were created for panel order CBC/DIFF.  Procedure                               Abnormality         Status                     ---------                               -----------         ------                     CBC WITH IPQQ[[id number]]                                                                 Please view results for these tests on the individual orders.   URINALYSIS, MACROSCOPIC AND MICROSCOPIC W/CULTURE REFLEX    Collection Time: 07/05/24  9:59 PM    Specimen: Urine, Straight Catheter    Narrative    The following orders were created for panel order URINALYSIS, MACROSCOPIC AND MICROSCOPIC W/CULTURE REFLEX.  Procedure                               Abnormality         Status                     ---------                                -----------         ------  URINALYSIS, MACROSCOPIC[[id number]]                                                     URINALYSIS, MICROSCOPIC[[id number]]                                                       Please view results for these tests on the individual orders.

## 2024-07-05 NOTE — ED Triage Notes (Signed)
 Nausea, abdominal and back pain all day.

## 2024-07-06 ENCOUNTER — Emergency Department (HOSPITAL_COMMUNITY)

## 2024-07-06 DIAGNOSIS — N200 Calculus of kidney: Secondary | ICD-10-CM

## 2024-07-06 DIAGNOSIS — D72829 Elevated white blood cell count, unspecified: Secondary | ICD-10-CM

## 2024-07-06 MED ORDER — CEPHALEXIN 500 MG CAPSULE
500.0000 mg | ORAL_CAPSULE | Freq: Four times a day (QID) | ORAL | 0 refills | Status: AC
Start: 2024-07-06 — End: 2024-07-16

## 2024-07-06 MED ORDER — IOPAMIDOL 370 MG IODINE/ML (76 %) INTRAVENOUS SOLUTION
75.0000 mL | INTRAVENOUS | Status: AC
Start: 2024-07-06 — End: 2024-07-06
  Administered 2024-07-06: 75 mL via INTRAVENOUS

## 2024-07-06 NOTE — ED Nurses Note (Signed)
 Pt educated on antibiotic stewardship. Pt ambulated out of ED with discharge instructions, scripts, and belongings.

## 2024-07-06 NOTE — ED Provider Notes (Signed)
 [facility] - Emergency Department  ED Primary Note  History of Present Illness   Gloria May is a 58 y.o. female who had concerns including Abdominal Pain.     Patient 58 year old female past will history of depression presents emergency department with complaints of right-sided abdominal pain and nausea that started today.  Denies fevers chills she does report some dysuria.    Physical Exam   ED Triage Vitals [07/05/24 2158]   BP (Non-Invasive) (!) 104/51   Heart Rate 93   Respiratory Rate 20   Temperature 37.1 C (98.7 F)   SpO2 97 %   Weight 87.1 kg (192 lb)   Height 1.626 m (5' 4)     Physical Exam  Constitutional:       Appearance: Normal appearance.   HENT:      Head: Normocephalic.      Nose: Nose normal.      Mouth/Throat:      Mouth: Mucous membranes are moist.   Eyes:      Extraocular Movements: Extraocular movements intact.      Pupils: Pupils are equal, round, and reactive to light.   Cardiovascular:      Rate and Rhythm: Normal rate and regular rhythm.      Pulses: Normal pulses.      Heart sounds: Normal heart sounds.   Pulmonary:      Effort: Pulmonary effort is normal.      Breath sounds: Normal breath sounds.   Abdominal:      General: Abdomen is flat.      Palpations: Abdomen is soft.      Tenderness: There is right CVA tenderness.   Musculoskeletal:      Cervical back: Normal range of motion.   Neurological:      Mental Status: She is alert.       Patient Data   Labs Ordered/Reviewed   COMPREHENSIVE METABOLIC PANEL, NON-FASTING - Abnormal; Notable for the following components:       Result Value    BUN/CREA RATIO 23 (*)     GLUCOSE 128 (*)     ALBUMIN/GLOBULIN RATIO 1.7 (*)     All other components within normal limits    Narrative:     Estimated Glomerular Filtration Rate (eGFR) is calculated using the CKD-EPI (2021) equation, intended for patients 6 years of age and older. If gender is not documented or unknown, there will be no eGFR calculation.     CBC WITH DIFF -  Abnormal; Notable for the following components:    WBC 16.1 (*)     MPV 6.6 (*)     NEUTROPHIL % 85 (*)     LYMPHOCYTE % 9 (*)     MONOCYTE % 3 (*)     NEUTROPHIL # 13.80 (*)     All other components within normal limits   URINALYSIS, MACROSCOPIC - Abnormal; Notable for the following components:    LEUKOCYTES 500 (*)     KETONES 40 (*)     All other components within normal limits   URINALYSIS, MICROSCOPIC - Abnormal; Notable for the following components:    RBCS 6 (*)     WBCS 104 (*)     All other components within normal limits   LACTIC ACID LEVEL W/ REFLEX FOR LEVEL >2.0 - Normal   URINE CULTURE,ROUTINE   ADULT ROUTINE BLOOD CULTURE, SET OF 2 BOTTLES (BACTERIA AND YEAST)   ADULT ROUTINE BLOOD CULTURE, SET OF 2 BOTTLES (BACTERIA AND YEAST)  CBC/DIFF    Narrative:     The following orders were created for panel order CBC/DIFF.  Procedure                               Abnormality         Status                     ---------                               -----------         ------                     CBC WITH IPQQ[[id number]]                Abnormal            Final result                 Please view results for these tests on the individual orders.   URINALYSIS, MACROSCOPIC AND MICROSCOPIC W/CULTURE REFLEX    Narrative:     The following orders were created for panel order URINALYSIS, MACROSCOPIC AND MICROSCOPIC W/CULTURE REFLEX.  Procedure                               Abnormality         Status                     ---------                               -----------         ------                     URINALYSIS, MACROSCOPIC[[id number]]      Abnormal            Final result               URINALYSIS, MICROSCOPIC[[id number]]      Abnormal            Final result                 Please view results for these tests on the individual orders.   BLUE TOP TUBE   EXTRA TUBES    Narrative:     The following orders were created for panel order EXTRA TUBES.  Procedure                               Abnormality         Status                      ---------                               -----------         ------                     PARIS TOP ULAZ[[id number]]  Final result               GOLD TOP ULAZ[[id number]]                                    In process                   Please view results for these tests on the individual orders.   GOLD TOP TUBE     CT ABDOMEN PELVIS W IV CONTRAST   Final Result by Edi, Radresults In (07/14 0039)      No acute inflammatory findings in the abdomen or pelvis.      Nonobstructive right kidney stone.                     Radiologist location ID: [id number]           Medical Decision Making        Medical Decision Making  CBC demonstrates leukocytosis white blood cell 16000 metabolic panel unremarkable urinalysis indicates urinary tract infection with 100 white blood cells per high-power field.  CT scan unremarkable for any obstructive uropathy or inflammatory changes.  Suspect likely pyelonephritis.  Patient was given Rocephin  here in the emergency department with parents cephalexin .  He is recommended to follow up with the primary care we discussed return precautions    Risk  Prescription drug management.                Medications Ordered/Administered in the ED   cefTRIAXone  (ROCEPHIN ) 1 g in NS 50 mL IVPB with adaptor (0 g Intravenous Stopped 07/05/24 2326)   ketorolac  (TORADOL ) 30 mg/mL injection (30 mg Intravenous Given 07/05/24 2342)   ondansetron  (ZOFRAN ) 2 mg/mL injection (4 mg Intravenous Given 07/05/24 2342)   iopamidol  (ISOVUE -370) 76% infusion (75 mL Intravenous Given 07/06/24 0027)     Clinical Impression   UTI (urinary tract infection) (Primary)       Disposition: Discharged

## 2024-07-06 NOTE — Discharge Instructions (Signed)
Push fluids take rest.  A prescription for antibiotics was written take this as prescribed.  Follow up with the primary care to make sure getting better.  If new or worsening symptoms return to the emergency department

## 2024-07-07 ENCOUNTER — Ambulatory Visit (HOSPITAL_BASED_OUTPATIENT_CLINIC_OR_DEPARTMENT_OTHER): Payer: Self-pay | Admitting: Family

## 2024-07-08 LAB — URINE CULTURE,ROUTINE: URINE CULTURE: 50000 — AB

## 2024-07-10 LAB — ADULT ROUTINE BLOOD CULTURE, SET OF 2 BOTTLES (BACTERIA AND YEAST)
BLOOD CULTURE, ROUTINE: NO GROWTH
BLOOD CULTURE, ROUTINE: NO GROWTH
